# Patient Record
Sex: Male | Born: 1975 | Race: White | Hispanic: No | Marital: Married | Smoking: Never smoker
Health system: Southern US, Community
[De-identification: ages and names within clinical notes are randomized; demographics above are authoritative.]

## PROBLEM LIST (undated history)

## (undated) DIAGNOSIS — Z789 Other specified health status: Secondary | ICD-10-CM

## (undated) HISTORY — PX: NO PAST SURGERIES: SHX2092

---

## 2014-05-08 ENCOUNTER — Ambulatory Visit: Payer: Self-pay

## 2015-01-09 ENCOUNTER — Ambulatory Visit
Admission: EM | Admit: 2015-01-09 | Discharge: 2015-01-09 | Disposition: A | Discharge status: Home / Self Care | Payer: No Typology Code available for payment source | Attending: Family Medicine | Admitting: Family Medicine

## 2015-01-09 DIAGNOSIS — L03116 Cellulitis of left lower limb: Secondary | ICD-10-CM

## 2015-01-09 HISTORY — DX: Other specified health status: Z78.9

## 2015-01-09 MED ORDER — TETANUS-DIPHTH-ACELL PERTUSSIS 5-2.5-18.5 LF-MCG/0.5 IM SUSP
0.5000 mL | Freq: Once | INTRAMUSCULAR | Status: AC
  Administered 2015-01-09: 0.5 mL via INTRAMUSCULAR

## 2015-01-09 MED ORDER — AMOXICILLIN-POT CLAVULANATE 875-125 MG PO TABS: 1.0000 | ORAL_TABLET | Freq: Two times a day (BID) | ORAL | Status: DC

## 2015-01-09 MED ORDER — CEFTRIAXONE SODIUM 1 G IJ SOLR
1.0000 g | Freq: Once | INTRAMUSCULAR | Status: AC
Start: 2015-01-09 — End: 2015-01-09
  Administered 2015-01-09: 1 g via INTRAMUSCULAR

## 2015-01-09 NOTE — ED Provider Notes (Signed)
CSN: [id number]     Arrival date & time 01/09/15  1649 History   First MD Initiated Contact with Patient 01/09/15 1743     Chief Complaint  Patient presents with  . Wound Infection   (Consider location/radiation/quality/duration/timing/severity/associated sxs/prior Treatment) HPI Comments: 39 yo male with a "bug bite" about 2 weeks ago which ulcerated and has been progressively, slowly worsening. Patient seen 2 days ago at another walk-in clinic and started on Bactrim. However patient has been working, on his feet, standing all day for the past couple of days. States redness and swelling seems to be worsening. Denies any fevers or chills.   The history is provided by the patient.    Past Medical History  Diagnosis Date  . Patient denies medical problems    Past Surgical History  Procedure Laterality Date  . No past surgeries     Family History  Problem Relation Age of Onset  . Emphysema Mother     smoker  . Diabetes Other    History  Substance Use Topics  . Smoking status: Never Smoker   . Smokeless tobacco: Never Used  . Alcohol Use: No    Review of Systems  Allergies  Review of patient's allergies indicates no known allergies.  Home Medications   Prior to Admission medications   Medication Sig Start Date End Date Taking? Authorizing Provider  ibuprofen (ADVIL,MOTRIN) 200 MG tablet Take 600 mg by mouth every 6 (six) hours as needed.   Yes Historical Provider, MD  sulfamethoxazole-trimethoprim (BACTRIM DS,SEPTRA DS) 800-160 MG per tablet Take 1 tablet by mouth 2 (two) times daily.   Yes Historical Provider, MD  amoxicillin-clavulanate (AUGMENTIN) 875-125 MG per tablet Take 1 tablet by mouth 2 (two) times daily. 01/09/15   Payton Mccallumrlando Makylie Rivere, MD   BP 119/66 mmHg  Pulse 57  Temp(Src) 98.2 F (36.8 C) (Oral)  Resp 16  Ht 5\' 10"  (1.778 m)  Wt 190 lb (86.183 kg)  BMI 27.26 kg/m2  SpO2 100% Physical Exam  Constitutional: He appears well-developed and well-nourished. No  distress.  Skin: He is not diaphoretic. There is erythema.  Left lower extremity neurovascularly intact; skin area on anterior lower shin with 15x25 cm blanchable erythema, mild tenderness to palpation and a 2cm weeping superficial ulceration near the center of the erythematous area; mild edema down to ankle  Nursing note and vitals reviewed.   ED Course  Procedures (including critical care time) Labs Review Labs Reviewed - No data to display  Imaging Review No results found.   MDM   1. Cellulitis of left leg    New Prescriptions   AMOXICILLIN-CLAVULANATE (AUGMENTIN) 875-125 MG PER TABLET    Take 1 tablet by mouth 2 (two) times daily.  Plan: 1. diagnosis reviewed with patient 2. rx as per orders; risks, benefits, potential side effects reviewed with patient 3. Recommend supportive treatment with elevation of extremity and warm compresses; close monitoring 4. Continue Bactrim for MRSA coverage 5. Patient given Rocephin 1gm IM x1 and tetanus vaccine in clinic 6. F/u in 2 days with PCP or here; sooner here or ED prn if symptoms worsening  Payton Mccallumrlando Elenie Coven, MD 01/09/15 [phone]

## 2015-01-09 NOTE — ED Notes (Signed)
Pt believes he was bitten by a bug 3 weeks ago on the LLE. [facility] Sunday, and was started on Bactrim. Pt reports good compliance with the treatment, but states the infection has gotten worse. There is associated pain, redness, oozing.

## 2015-01-09 NOTE — Discharge Instructions (Signed)

## 2018-09-08 ENCOUNTER — Other Ambulatory Visit: Payer: Self-pay

## 2018-09-08 ENCOUNTER — Ambulatory Visit
Admission: EM | Admit: 2018-09-08 | Discharge: 2018-09-08 | Disposition: A | Discharge status: Home / Self Care | Payer: BLUE CROSS/BLUE SHIELD | Attending: Family Medicine | Admitting: Family Medicine

## 2018-09-08 ENCOUNTER — Encounter: Payer: Self-pay | Admitting: *Deleted

## 2018-09-08 DIAGNOSIS — R0602 Shortness of breath: Secondary | ICD-10-CM | POA: Diagnosis not present

## 2018-09-08 DIAGNOSIS — R062 Wheezing: Secondary | ICD-10-CM | POA: Diagnosis not present

## 2018-09-08 DIAGNOSIS — R05 Cough: Secondary | ICD-10-CM

## 2018-09-08 DIAGNOSIS — J189 Pneumonia, unspecified organism: Secondary | ICD-10-CM

## 2018-09-08 DIAGNOSIS — J181 Lobar pneumonia, unspecified organism: Principal | ICD-10-CM

## 2018-09-08 MED ORDER — AZITHROMYCIN 250 MG PO TABS: ORAL_TABLET | ORAL | 0 refills | Status: DC

## 2018-09-08 MED ORDER — HYDROCODONE-HOMATROPINE 5-1.5 MG/5ML PO SYRP: 5.0000 mL | ORAL_SOLUTION | Freq: Four times a day (QID) | ORAL | 0 refills | Status: DC | PRN

## 2018-09-08 NOTE — Discharge Instructions (Signed)
Fluids.  Medication as directed.  Use the cough medicine as needed.  Take care  Dr. Adriana Simas

## 2018-09-08 NOTE — ED Triage Notes (Signed)
Patient reports cough and SOB since Monday. No fever.

## 2018-09-09 NOTE — ED Provider Notes (Signed)
[facility]    CSN: [id number] Arrival date & time: 09/08/18  1847   History   Chief Complaint Chief Complaint  Patient presents with  . Cough  . Shortness of Breath   HPI  43 year old male presents with the above complaints.   Symptoms started abruptly on Monday. Reports cough, wheezing. Associated SOB. No documented fever. Symptoms are worsening. He has been using a humidifier without resolution. No known exacerbating factors. No reports of body aches. Has had recent sick contacts at home. No other associated symptoms. No other complaints.   PMH, Surgical Hx, Family Hx, Social History reviewed and updated as below.  Past Medical History:  Diagnosis Date  . Patient denies medical problems    Past Surgical History:  Procedure Laterality Date  . NO PAST SURGERIES      Home Medications    Prior to Admission medications   Medication Sig Start Date End Date Taking? Authorizing Provider  azithromycin (ZITHROMAX) 250 MG tablet 2 tablets on day 1, then 1 tablet daily on days 2-5. 09/08/18   Sheana Bir, Verdis Frederickson, DO  HYDROcodone-homatropine (HYCODAN) 5-1.5 MG/5ML syrup Take 5 mLs by mouth every 6 (six) hours as needed for cough. 09/08/18   Tommie Sams, DO  ibuprofen (ADVIL,MOTRIN) 200 MG tablet Take 600 mg by mouth every 6 (six) hours as needed.    [provider]   Family History Family History  Problem Relation Age of Onset  . Emphysema Mother        smoker  . Diabetes Other    Social History Social History   Tobacco Use  . Smoking status: Never Smoker  . Smokeless tobacco: Never Used  Substance Use Topics  . Alcohol use: No  . Drug use: No    Allergies   Patient has no known allergies.   Review of Systems Review of Systems  Constitutional: Negative for fever.  Respiratory: Positive for cough, shortness of breath and wheezing.    Physical Exam Triage Vital Signs ED Triage Vitals  Enc Vitals Group     BP 09/08/18 1920 (!) 134/94     Pulse  Rate 09/08/18 1920 72     Resp 09/08/18 1920 17     Temp 09/08/18 1920 98.3 F (36.8 C)     Temp Source 09/08/18 1920 Oral     SpO2 09/08/18 1920 99 %     Weight --      Height --      Head Circumference --      Peak Flow --      Pain Score 09/08/18 1919 0     Pain Loc --      Pain Edu? --      Excl. in GC? --    Updated Vital Signs BP (!) 134/94 (BP Location: Left Arm)   Pulse 72   Temp 98.3 F (36.8 C) (Oral)   Resp 17   SpO2 99%   Visual Acuity Right Eye Distance:   Left Eye Distance:   Bilateral Distance:    Right Eye Near:   Left Eye Near:    Bilateral Near:     Physical Exam Vitals signs and nursing note reviewed.  Constitutional:      General: He is not in acute distress.    Appearance: He is well-developed.  HENT:     Head: Normocephalic and atraumatic.     Right Ear: Tympanic membrane normal.     Left Ear: Tympanic membrane normal.  Mouth/Throat:     Pharynx: Oropharynx is clear. No oropharyngeal exudate or posterior oropharyngeal erythema.  Eyes:     General:        Right eye: No discharge.        Left eye: No discharge.     Conjunctiva/sclera: Conjunctivae normal.  Neck:     Musculoskeletal: Neck supple.  Cardiovascular:     Rate and Rhythm: Normal rate and regular rhythm.  Pulmonary:     Effort: Pulmonary effort is normal.     Comments: Left basilar crackles and wheezing. Lymphadenopathy:     Cervical: No cervical adenopathy.  Neurological:     Mental Status: He is alert.  Psychiatric:        Mood and Affect: Mood normal.        Behavior: Behavior normal.    UC Treatments / Results  Labs (all labs ordered are listed, but only abnormal results are displayed) Labs Reviewed - No data to display  EKG None  Radiology No results found.  Procedures Procedures (including critical care time)  Medications Ordered in UC Medications - No data to display  Initial Impression / Assessment and Plan / UC Course  I have reviewed the  triage vital signs and the nursing notes.  Pertinent labs & imaging results that were available during my care of the patient were reviewed by me and considered in my medical decision making (see chart for details).    43 year old male presents with suspected pneumonia based off of physical exam findings. We discussed imaging vs empiric treatment. Patient elected for the latter. Starting on Azithromycin. Hycodan for cough.   Final Clinical Impressions(s) / UC Diagnoses   Final diagnoses:  Community acquired pneumonia of left lower lobe of lung (HCC)     Discharge Instructions     Fluids.  Medication as directed.  Use the cough medicine as needed.  Take care  Dr. Adriana Simas    ED Prescriptions    Medication Sig Dispense Auth. Provider   azithromycin (ZITHROMAX) 250 MG tablet 2 tablets on day 1, then 1 tablet daily on days 2-5. 6 tablet Waleska Buttery G, DO   HYDROcodone-homatropine (HYCODAN) 5-1.5 MG/5ML syrup Take 5 mLs by mouth every 6 (six) hours as needed for cough. 120 mL Tommie Sams, DO     Controlled Substance Prescriptions [place] Controlled Substance Registry consulted? Not Applicable   Tommie Sams, [place] 09/09/18 2778

## 2018-09-27 ENCOUNTER — Ambulatory Visit (INDEPENDENT_AMBULATORY_CARE_PROVIDER_SITE_OTHER): Payer: BLUE CROSS/BLUE SHIELD

## 2018-09-27 ENCOUNTER — Ambulatory Visit
Admission: EM | Admit: 2018-09-27 | Discharge: 2018-09-27 | Disposition: A | Discharge status: Home / Self Care | Payer: BLUE CROSS/BLUE SHIELD | Attending: Family Medicine | Admitting: Family Medicine

## 2018-09-27 ENCOUNTER — Other Ambulatory Visit: Payer: Self-pay

## 2018-09-27 ENCOUNTER — Encounter: Payer: Self-pay | Admitting: Emergency Medicine

## 2018-09-27 DIAGNOSIS — R05 Cough: Secondary | ICD-10-CM

## 2018-09-27 DIAGNOSIS — J0110 Acute frontal sinusitis, unspecified: Secondary | ICD-10-CM

## 2018-09-27 DIAGNOSIS — J3089 Other allergic rhinitis: Secondary | ICD-10-CM

## 2018-09-27 DIAGNOSIS — R059 Cough, unspecified: Secondary | ICD-10-CM

## 2018-09-27 MED ORDER — DOXYCYCLINE HYCLATE 100 MG PO CAPS: 100.0000 mg | ORAL_CAPSULE | Freq: Two times a day (BID) | ORAL | 0 refills | Status: AC

## 2018-09-27 MED ORDER — ALBUTEROL SULFATE HFA 108 (90 BASE) MCG/ACT IN AERS: 2.0000 | INHALATION_SPRAY | Freq: Four times a day (QID) | RESPIRATORY_TRACT | 0 refills | Status: AC | PRN

## 2018-09-27 NOTE — Discharge Instructions (Addendum)
Recommend start Albuterol inhaler 2 puffs every 6 hours as needed for cough or chest tightness. Switch to another OTC antihistamine daily. If sinus pressure and symptoms do not improve within 1 to 2 days, may start Doxycycline 100mg  twice a day as directed. May continue OTC Nyquil at night. Continue to increase fluids to help loosen up mucus in chest. Follow-up in 7 to 10 days if symptoms are not resolving.

## 2018-09-27 NOTE — ED Provider Notes (Signed)
[facility]    CSN: [id number] Arrival date & time: 09/27/18  1041     History   Chief Complaint Chief Complaint  Patient presents with   Cough    HPI Dennis Hood is a 43 y.o. male.   43 year old male presents with continued cough and occasional shortness of breath for over 3 weeks. He was seen here on 09/08/2018 for probable left lower pneumonia and started on Zithromax and cough medication with codeine. He finished the Zithromax but still had lingering cough with mucus. Denies any fever, sore throat or GI symptoms. Now having more frontal sinus pressure and congestion, especially when coughing. He does have seasonal allergies and has been taking OTC generic Allegra or similar and Flonase with minimal relief. Currently he is taking Nyquil at night with some relief. No other family members ill. Does not smoke. No recent travel or contact with positive COVID-19. Otherwise no chronic health issues. Takes no daily medication.   The history is provided by the patient.    Past Medical History:  Diagnosis Date   Patient denies medical problems     There are no active problems to display for this patient.   Past Surgical History:  Procedure Laterality Date   NO PAST SURGERIES         Home Medications    Prior to Admission medications   Medication Sig Start Date End Date Taking? Authorizing Provider  albuterol (VENTOLIN HFA) 108 (90 Base) MCG/ACT inhaler Inhale 2 puffs into the lungs every 6 (six) hours as needed for wheezing (or chest tightness). 09/27/18   Sudie Grumbling, NP  doxycycline (VIBRAMYCIN) 100 MG capsule Take 1 capsule (100 mg total) by mouth 2 (two) times daily for 7 days. 09/27/18 10/04/18  Sudie Grumbling, NP    Family History Family History  Problem Relation Age of Onset   Emphysema Mother        smoker   Heart attack Father 45   Diabetes Other     Social History Social History   Tobacco Use   Smoking status: Never Smoker    Smokeless tobacco: Never Used  Substance Use Topics   Alcohol use: No   Drug use: No     Allergies   Patient has no known allergies.   Review of Systems Review of Systems  Constitutional: Negative for activity change, appetite change, chills, fatigue and fever.  HENT: Positive for congestion (mainly nasal ), postnasal drip and sinus pressure. Negative for ear discharge, ear pain, facial swelling, mouth sores, nosebleeds, rhinorrhea, sinus pain, sneezing, sore throat and trouble swallowing.   Eyes: Negative for pain, discharge, redness and itching.  Respiratory: Positive for cough, shortness of breath and wheezing. Negative for chest tightness and stridor.   Cardiovascular: Negative for chest pain.  Gastrointestinal: Negative for abdominal pain, nausea and vomiting.  Musculoskeletal: Negative for arthralgias, myalgias, neck pain and neck stiffness.  Skin: Negative for color change, rash and wound.  Allergic/Immunologic: Positive for environmental allergies. Negative for immunocompromised state.  Neurological: Positive for headaches. Negative for dizziness, tremors, seizures, syncope, weakness, light-headedness and numbness.  Hematological: Negative for adenopathy. Does not bruise/bleed easily.     Physical Exam Triage Vital Signs ED Triage Vitals  Enc Vitals Group     BP 09/27/18 1050 (!) 143/96     Pulse Rate 09/27/18 1050 66     Resp 09/27/18 1050 16     Temp 09/27/18 1050 98 F (36.7 C)  Temp Source 09/27/18 1050 Oral     SpO2 09/27/18 1050 100 %     Weight 09/27/18 1049 215 lb (97.5 kg)     Height 09/27/18 1049 5\' 10"  (1.778 m)     Head Circumference --      Peak Flow --      Pain Score 09/27/18 1049 0     Pain Loc --      Pain Edu? --      Excl. in GC? --    No data found.  Updated Vital Signs BP (!) 143/96 (BP Location: Left Arm)    Pulse 66    Temp 98 F (36.7 C) (Oral)    Resp 16    Ht 5\' 10"  (1.778 m)    Wt 215 lb (97.5 kg)    SpO2 100%    BMI 30.85  kg/m   Visual Acuity Right Eye Distance:   Left Eye Distance:   Bilateral Distance:    Right Eye Near:   Left Eye Near:    Bilateral Near:     Physical Exam Vitals signs and nursing note reviewed.  Constitutional:      General: He is awake. He is not in acute distress.    Appearance: Normal appearance. He is well-developed and well-groomed. He is not ill-appearing.     Comments: Patient sitting comfortably on exam table in no acute distress.   HENT:     Head: Normocephalic and atraumatic.     Right Ear: Hearing, tympanic membrane, ear canal and external ear normal.     Left Ear: Hearing, tympanic membrane, ear canal and external ear normal.     Nose: No congestion or rhinorrhea.     Right Turbinates: Swollen.     Left Turbinates: Swollen.     Right Sinus: Frontal sinus tenderness present. No maxillary sinus tenderness.     Left Sinus: Frontal sinus tenderness present. No maxillary sinus tenderness.     Mouth/Throat:     Lips: Pink.     Mouth: Mucous membranes are moist.     Pharynx: Uvula midline. Posterior oropharyngeal erythema present. No pharyngeal swelling or oropharyngeal exudate.  Eyes:     Extraocular Movements: Extraocular movements intact.     Conjunctiva/sclera: Conjunctivae normal.  Neck:     Musculoskeletal: Normal range of motion and neck supple. No neck rigidity or muscular tenderness.  Cardiovascular:     Rate and Rhythm: Normal rate and regular rhythm.     Heart sounds: Normal heart sounds. No murmur.  Pulmonary:     Effort: Pulmonary effort is normal. No respiratory distress.     Breath sounds: Normal air entry. No stridor. Examination of the right-upper field reveals decreased breath sounds and wheezing. Examination of the left-upper field reveals decreased breath sounds and wheezing. Examination of the right-lower field reveals decreased breath sounds. Examination of the left-lower field reveals decreased breath sounds. Decreased breath sounds and wheezing  present. No rhonchi or rales.     Comments: Slight coarse breath sounds in lower lobes when coughing. Mild wheezes in upper lobes bilaterally.  Musculoskeletal: Normal range of motion.  Lymphadenopathy:     Cervical: No cervical adenopathy.  Skin:    General: Skin is warm and dry.     Capillary Refill: Capillary refill takes less than 2 seconds.     Findings: No rash.  Neurological:     General: No focal deficit present.     Mental Status: He is alert and oriented to person, place,  and time.  Psychiatric:        Mood and Affect: Mood normal.        Behavior: Behavior normal. Behavior is cooperative.        Thought Content: Thought content normal.        Judgment: Judgment normal.      UC Treatments / Results  Labs (all labs ordered are listed, but only abnormal results are displayed) Labs Reviewed - No data to display  EKG None  Radiology Dg Chest 2 View  Result Date: 09/27/2018 CLINICAL DATA:  Cough for 3 weeks.  Shortness of breath. EXAM: CHEST - 2 VIEW COMPARISON:  None. FINDINGS: The heart size and mediastinal contours are within normal limits. Both lungs are clear. The visualized skeletal structures are unremarkable. IMPRESSION: No active cardiopulmonary disease. Electronically Signed   By: Elige Ko   On: 09/27/2018 11:39    Procedures Procedures (including critical care time)  Medications Ordered in UC Medications - No data to display  Initial Impression / Assessment and Plan / UC Course  I have reviewed the triage vital signs and the nursing notes.  Pertinent labs & imaging results that were available during my care of the patient were reviewed by me and considered in my medical decision making (see chart for details).    Reviewed chest x-ray results with patient- no pneumonia or fluid. Discussed that he may have a cough due to sinus drainage from either early frontal sinus infection and/or seasonal allergies. Recommend trial Albuterol inhaler 2 puffs every 6  hours as needed for cough or wheezing. Recommend switch to a different OTC antihistamine (since uncertain if it is generic Allegra or Zyrtec) and continue Flonase as directed. If sinus pressure and symptoms gets worse in the next 2 days, may start Doxycycline 100mg  twice a day as directed. May continue Nyquil at night as needed. Continue to increase fluids to help loosen up mucus in chest. May also use a Netipot as directed for sinus congestion. Follow-up in 7 to 10 days if symptoms are not resolving.  Final Clinical Impressions(s) / UC Diagnoses   Final diagnoses:  Cough  Acute non-recurrent frontal sinusitis  Allergic rhinitis due to other allergic trigger, unspecified seasonality     Discharge Instructions     Recommend start Albuterol inhaler 2 puffs every 6 hours as needed for cough or chest tightness. Switch to another OTC antihistamine daily. If sinus pressure and symptoms do not improve within 1 to 2 days, may start Doxycycline 100mg  twice a day as directed. May continue OTC Nyquil at night. Continue to increase fluids to help loosen up mucus in chest. Follow-up in 7 to 10 days if symptoms are not resolving.     ED Prescriptions    Medication Sig Dispense Auth. Provider   albuterol (VENTOLIN HFA) 108 (90 Base) MCG/ACT inhaler Inhale 2 puffs into the lungs every 6 (six) hours as needed for wheezing (or chest tightness). 1 Inhaler Kayden Amend, Ali Lowe, NP   doxycycline (VIBRAMYCIN) 100 MG capsule Take 1 capsule (100 mg total) by mouth 2 (two) times daily for 7 days. 14 capsule Sudie Grumbling, NP     Controlled Substance Prescriptions [place] Controlled Substance Registry consulted? Not Applicable   Sudie Grumbling, NP 09/27/18 [phone]

## 2018-09-27 NOTE — ED Triage Notes (Signed)
Patient in today c/o continued cough after being seen here on 09/08/18.

## 2018-12-29 ENCOUNTER — Other Ambulatory Visit: Payer: Self-pay

## 2018-12-29 ENCOUNTER — Ambulatory Visit (INDEPENDENT_AMBULATORY_CARE_PROVIDER_SITE_OTHER): Payer: BLUE CROSS/BLUE SHIELD

## 2018-12-29 ENCOUNTER — Telehealth: Payer: Self-pay

## 2018-12-29 ENCOUNTER — Ambulatory Visit
Admission: EM | Admit: 2018-12-29 | Discharge: 2018-12-29 | Disposition: A | Discharge status: Home / Self Care | Payer: BLUE CROSS/BLUE SHIELD | Attending: Family Medicine | Admitting: Family Medicine

## 2018-12-29 ENCOUNTER — Other Ambulatory Visit: Payer: No Typology Code available for payment source

## 2018-12-29 ENCOUNTER — Encounter: Payer: Self-pay | Admitting: Emergency Medicine

## 2018-12-29 DIAGNOSIS — R05 Cough: Secondary | ICD-10-CM | POA: Diagnosis not present

## 2018-12-29 DIAGNOSIS — Z7189 Other specified counseling: Secondary | ICD-10-CM | POA: Diagnosis not present

## 2018-12-29 DIAGNOSIS — Z20822 Contact with and (suspected) exposure to covid-19: Secondary | ICD-10-CM

## 2018-12-29 DIAGNOSIS — J189 Pneumonia, unspecified organism: Secondary | ICD-10-CM | POA: Diagnosis not present

## 2018-12-29 DIAGNOSIS — R059 Cough, unspecified: Secondary | ICD-10-CM

## 2018-12-29 MED ORDER — AZITHROMYCIN 250 MG PO TABS: 250.0000 mg | ORAL_TABLET | Freq: Every day | ORAL | 0 refills | Status: AC

## 2018-12-29 MED ORDER — DIPHENHYDRAMINE HCL 2 % EX CREA: TOPICAL_CREAM | Freq: Three times a day (TID) | CUTANEOUS | 0 refills | Status: DC | PRN

## 2018-12-29 MED ORDER — AMOXICILLIN-POT CLAVULANATE ER 1000-62.5 MG PO TB12: 2.0000 | ORAL_TABLET | Freq: Two times a day (BID) | ORAL | 0 refills | Status: AC

## 2018-12-29 NOTE — ED Triage Notes (Signed)
Patient states he has had some shortness of breath and a low grade fever since Saturday

## 2018-12-29 NOTE — Telephone Encounter (Signed)
Contacted by Overton Mam NP. At [facility]. Pt needs COVID-19 testing.  Office [phone] Fax [phone]  Pt is at office Appointment scheduled and order placed. Pt made aware of all instructions by Overton Mam NP.

## 2018-12-29 NOTE — Discharge Instructions (Addendum)
It was very nice seeing you today in clinic. Thank you for entrusting me with your care.   As discussed, you have pneumonia. Please utilize the medications that we discussed. Your prescriptions have been called in to your pharmacy. May use Tylenol and/or Ibuprofen as needed for pain/fever. Increase fluid intake as much as possible. Water is always best, as sugar and caffeine containing fluids can cause you to become dehydrated. Try to incorporate electrolyte enriched fluids, such as Gatorade or Pedialyte, into your daily fluid intake.   COVID testing today at [facility]. Be there by 3:30 - 3:45. Stay in your car. Wear mask.    Make arrangements to follow up with your regular doctor in 1 week for re-evaluation, or sooner if you are worsening. If your symptoms/condition worsens, please seek follow up care either here or in the ER. Please remember, our [facility] providers are "right here with you" when you need [place].   Again, it was my pleasure to take care of you today. Thank you for choosing our clinic. I hope that you start to feel better quickly.   Honor Loh, MSN, APRN, FNP-C, CEN Advanced Practice Provider [facility]

## 2018-12-29 NOTE — ED Provider Notes (Addendum)
Name: Dennis NippleWilliam Pichette DOB: 11-Oct-1975 MRN: [id number] CSN: [id number] PCP: Patient, No Pcp Per  Arrival date and time:  12/29/18 1325  Chief Complaint:  Shortness of Breath  NOTE: Prior to seeing the patient today, I have reviewed the triage nursing documentation and vital signs. Clinical staff has updated patient's PMH/PSHx, current medication list, and drug allergies/intolerances to ensure comprehensive history available to assist in medical decision making.   History:   HPI: Dennis Hood is a 43 y.o. male who presents today with complaints of cough and shortness of breath since Saturday (12/25/2018). Patient does not smoke. He denies known contact with sick individuals; works with the public as a Paediatric nursebarber. Patient reporting low grade temperatures (Tmax 99.5). He has experienced marked fatigue with associated diffuse myalgias. He denies nausea, vomiting, diarrhea, and chills. He notes that his appetite has been decreased secondary to newly developed dysgeusia and anosmia; "I can't smell anything and things just taste funny". Patient taking daily OTC allergy medications to help with his symptoms.   Of note, patient was seen here back on 09/08/2018 by Dr. Adriana Simasook. Patient with cough, wheezing, and SOB. Imaging was deferred, and patient was treated based on exam for CAP using azithromycin. Patient came back into the clinic on 09/27/2018, and was seen by Carita PianAmyot, NP for persistent symptoms. He had completed the prescribed course of antibiotics, and was using daily allergy medications, however his symptoms had not improved. Radiographs of the chest revealed no evidence of peribronchial thickening, areas of consolidation, or focal infiltrates. Trial of albuterol was discussed. Patient given a provisional prescription of doxycycline if not clinically improving in 2 days time. Patient advising that he ended up taking the prescribed doxycycline, which improved his symptoms.    Past Medical History:    Diagnosis Date   Patient denies medical problems     Past Surgical History:  Procedure Laterality Date   NO PAST SURGERIES      Family History  Problem Relation Age of Onset   Emphysema Mother        smoker   Heart attack Father [id number]   Diabetes Other     Social History   Socioeconomic History   Marital status: Married    Spouse name: Not on file   Number of children: Not on file   Years of education: Not on file   Highest education level: Not on file  Occupational History   Not on file  Social Needs   Financial resource strain: Not on file   Food insecurity    Worry: Not on file    Inability: Not on file   Transportation needs    Medical: Not on file    Non-medical: Not on file  Tobacco Use   Smoking status: Never Smoker   Smokeless tobacco: Never Used  Substance and Sexual Activity   Alcohol use: No   Drug use: No   Sexual activity: Not on file  Lifestyle   Physical activity    Days per week: Not on file    Minutes per session: Not on file   Stress: Not on file  Relationships   Social connections    Talks on phone: Not on file    Gets together: Not on file    Attends religious service: Not on file    Active member of club or organization: Not on file    Attends meetings of clubs or organizations: Not on file    Relationship status: Not on file   Intimate  partner violence    Fear of current or ex partner: Not on file    Emotionally abused: Not on file    Physically abused: Not on file    Forced sexual activity: Not on file  Other Topics Concern   Not on file  Social History Narrative   Not on file    There are no active problems to display for this patient.   Home Medications:    Current Meds  Medication Sig   albuterol (VENTOLIN HFA) 108 (90 Base) MCG/ACT inhaler Inhale 2 puffs into the lungs every 6 (six) hours as needed for wheezing (or chest tightness).    Allergies:   Patient has no known allergies.  Review  of Systems (ROS): Review of Systems  Constitutional: Positive for activity change (decreased), appetite change (decreased), fatigue and fever (low grade; Tmax 99.5). Negative for chills.  HENT: Negative for congestion, postnasal drip, rhinorrhea, sinus pressure, sinus pain, sneezing, sore throat and trouble swallowing.        (+) dysgeusia. (+) anosmia  Respiratory: Positive for cough, chest tightness and shortness of breath.   Cardiovascular: Negative for chest pain and palpitations.  Gastrointestinal: Negative for abdominal pain, diarrhea, nausea and vomiting.  Genitourinary: Negative.   Musculoskeletal: Positive for myalgias. Negative for back pain and neck pain.  Skin: Negative.   Neurological: Positive for weakness (generalized). Negative for dizziness, light-headedness and headaches.  Hematological: Negative for adenopathy.     Physical Exam:  Triage Vital Signs ED Triage Vitals  Enc Vitals Group     BP 12/29/18 1336 125/86     Pulse Rate 12/29/18 1336 74     Resp 12/29/18 1336 18     Temp 12/29/18 1336 98.6 F (37 C)     Temp Source 12/29/18 1336 Oral     SpO2 12/29/18 1336 98 %     Weight 12/29/18 1337 210 lb (95.3 kg)     Height 12/29/18 1337 5\' 10"  (1.778 m)     Head Circumference --      Peak Flow --      Pain Score 12/29/18 1337 3     Pain Loc --      Pain Edu? --      Excl. in GC? --     Physical Exam  Constitutional: He is oriented to person, place, and time and well-developed, well-nourished, and in no distress.  HENT:  Head: Normocephalic and atraumatic.  Right Ear: External ear normal.  Left Ear: External ear normal.  Mouth/Throat: Oropharynx is clear and moist and mucous membranes are normal.  Eyes: Pupils are equal, round, and reactive to light. EOM are normal.  Neck: Normal range of motion. Neck supple. No tracheal deviation present.  Cardiovascular: Normal rate, regular rhythm, normal heart sounds and intact distal pulses. Exam reveals no gallop and  no friction rub.  No murmur heard. Pulmonary/Chest: Effort normal. No respiratory distress. He has decreased breath sounds in the right lower field. He has no wheezes. He has rhonchi (clears somewhat with cough) in the right lower field, the left upper field and the left lower field. He has no rales.  Abdominal: Soft. Bowel sounds are normal. There is no abdominal tenderness.  Lymphadenopathy:    He has no cervical adenopathy.  Neurological: He is alert and oriented to person, place, and time. Gait normal. GCS score is 15.  Skin: Skin is warm and dry. No rash noted. No erythema.  Psychiatric: Mood, affect and judgment normal.  Nursing note and  vitals reviewed.    Urgent Care Treatments / Results:   LABS: PLEASE NOTE: all labs that were ordered this encounter are listed, however only abnormal results are displayed. Labs Reviewed - No data to display  URGENT CARE ECG REPORT Date: 12/29/2018 Time ECG obtained: 1346 Rate: 65 bpm Rhythm: normal sinus rhythm Axis (leads I and aVF): normal Intervals: normal ST segment and T wave changes: No evidence of ST segment elevation of depression Comparison: No previous tracings available for review and comparison.   RADIOLOGY: Dg Chest 2 View  Result Date: 12/29/2018 CLINICAL DATA:  Cough and shortness of breath. EXAM: CHEST - 2 VIEW COMPARISON:  09/27/2018 FINDINGS: New patchy densities along the periphery of the left lung and left hilar region. Few densities in the periphery of the mid right lung. Heart size is within normal limits and stable. Negative for a pneumothorax. Trachea is midline. No large pleural effusions. Bone structures are unremarkable. IMPRESSION: New airspace densities in left lung. Findings are compatible with pneumonia. Question a small focus of infection in the right lung as described. Electronically Signed   By: Markus Daft M.D.   On: 12/29/2018 14:43   PROCEDURES: Procedures  MEDICATIONS RECEIVED THIS VISIT: Medications -  No data to display  PERTINENT CLINICAL COURSE NOTES/UPDATES:   Initial Impression / Assessment and Plan / Urgent Care Course:    Fredrick Geoghegan is a 43 y.o. male who presents to [facility] today with complaints of Shortness of Breath  Pertinent labs & imaging results that were available during my care of the patient were personally reviewed by me and considered in my medical decision making (see lab/imaging section of note for values and interpretations).  Patient acutely ill appearing (non-toxic) today in clinic. He is in no acute distress. Exam reveals decreased breath sounds in the RLL. He has rhonchi that improve somewhat with cough in the RLL, LUL, and LLL. No fevers; low grade temperatures (Tmax 99.5). He feels short of breath. (+) decreased appetite; making efforts to remain hydrated. No nausea, vomiting, or diarrhea. He notes diffuse myalgias. EKG shows NSR without ectopy at a rate of 65 bpm. Radiographs of the chest reveal airspace densities in the periphery of the LEFT lung, with questionable are of infectious focus in the RLL. Finding consistent with BILATERAL community acquired pneumonia. Discussed outpatient vs. inpatient treatment with patient. Patient would rather pursue treatment on an outpatient basis if possible. His exam is reassuring overall. His breathing is stable; no accessory muscle use or retractions. VS are normal; no hypoxia, tachypnea, or tachycardia. Based on his subjective and objective assessment, I feel as if outpatient treatment is reasonable for this patient. Reviewed clinical presentation with attending Lacinda Axon, MD) who also feels as if patient can be treated successfully as an outpatient. Given his history of recurrent symptoms, and past antimicrobial treatment regimens, I will treat with high dose Augmentin XR 2000/125 mg BID x 10 days, adding in atypical coverage with azithromycin x 5 days. May use Tylenol and/or Ibuprofen as needed for pain/fever. He was  encouraged to ensure that he was maintaining adequate hydration and resting as much as possible.   Given his symptom constellation and potential risk for exposure working with the public, we discussed that SARS-CoV-2 (novel coronavirus) had to be considered. Discussed having patient tested for the viral infection today. Patient aware that SARS-CoV-2 could be contributory to his current pneumonia. Questions fielded and reassurance provided. Patient in agreement with discussed plan of care. Call placed to testing  center and appointment secured for 1545 today to have testing performed. Patient to self quarantine, based on [place] DHHS guidelines, until negative results received.  Discussed follow up with primary care physician in 1 week for re-evaluation. I have reviewed the follow up and strict return precautions for any new or worsening symptoms. Patient is aware of symptoms that would be deemed urgent/emergent, and would thus require further evaluation either here or in the emergency department. At the time of discharge, he verbalized understanding and consent with the discharge plan as it was reviewed with him. All questions were fielded by provider and/or clinic staff prior to patient discharge.    Final Clinical Impressions(s) / Urgent Care Diagnoses:   Final diagnoses:  Cough  Advice Given About Covid-19 Virus Infection  Community acquired pneumonia, unspecified laterality    New Prescriptions:   Meds ordered this encounter  Medications   amoxicillin-clavulanate (AUGMENTIN XR) 1000-62.5 MG 12 hr tablet    Sig: Take 2 tablets by mouth 2 (two) times daily.    Dispense:  40 tablet    Refill:  0   azithromycin (ZITHROMAX) 250 MG tablet    Sig: Take 1 tablet (250 mg total) by mouth daily. Take 2 tabs (500 mg) x 1 day, then 1 tab (250 mg) daily x 4 days.    Dispense:  6 tablet    Refill:  0    Controlled Substance Prescriptions:  [place] Controlled Substance Registry consulted? Not  Applicable  NOTE: This note was prepared using Dragon dictation software along with smaller phrase technology. Despite my best ability to proofread, there is the potential that transcriptional errors may still occur from this process, and are completely unintentional.    Verlee MonteGray, Axcel Horsch E, NP 12/30/18 0003

## 2019-01-02 LAB — NOVEL CORONAVIRUS, NAA: SARS-CoV-2, NAA: DETECTED — AB

## 2019-02-11 ENCOUNTER — Telehealth: Payer: Self-pay | Admitting: Emergency Medicine

## 2019-02-11 NOTE — Telephone Encounter (Signed)
Patients wife called regarding bill for a recent visit.  Wife states she did not understand why the was being billed for an EKG.  I went over patients signed AOB and explained that the co pay is for the initial visit but the additional services such as EKG, X-ray, equipment are billed in addition to the co pay. Patients wife was very appreciative of the explanation and verbalized understanding.

## 2019-03-19 ENCOUNTER — Ambulatory Visit
Admission: EM | Admit: 2019-03-19 | Discharge: 2019-03-19 | Disposition: A | Discharge status: Home / Self Care | Payer: BLUE CROSS/BLUE SHIELD | Attending: Emergency Medicine | Admitting: Emergency Medicine

## 2019-03-19 ENCOUNTER — Other Ambulatory Visit: Payer: Self-pay

## 2019-03-19 ENCOUNTER — Encounter: Payer: Self-pay | Admitting: Emergency Medicine

## 2019-03-19 DIAGNOSIS — J42 Unspecified chronic bronchitis: Secondary | ICD-10-CM

## 2019-03-19 DIAGNOSIS — J189 Pneumonia, unspecified organism: Secondary | ICD-10-CM

## 2019-03-19 MED ORDER — LEVOFLOXACIN 750 MG PO TABS: 750.0000 mg | ORAL_TABLET | Freq: Every day | ORAL | 0 refills | Status: AC

## 2019-03-19 MED ORDER — MONTELUKAST SODIUM 10 MG PO TABS
10.0000 mg | ORAL_TABLET | Freq: Every day | ORAL | 0 refills | Status: AC
Start: 2019-03-19 — End: 2019-04-02

## 2019-03-19 NOTE — ED Provider Notes (Signed)
[facility] - [place], Dennis Hood   Name: Dennis Hood DOB: [birth year] MRN: [id number] CSN: [id number] PCP: Patient, No Pcp Per  Arrival date and time:  03/19/19 1008  Chief Complaint:  Sinus Problem and Cough   NOTE: Prior to seeing the patient today, I have reviewed the triage nursing documentation and vital signs. Clinical staff has updated patient's PMH/PSHx, current medication list, and drug allergies/intolerances to ensure comprehensive history available to assist in medical decision making.   History:   HPI: Dennis Hood is a 43 y.o. male who presents today with complaints of cough, chest congestion and drainage. This is a chronic problem with an acute exacerbation. He states he's had "lung problems" for 18 years, but has never seen a specialist. This current episode started about 3 days ago with green nasal drainage, post nasal drainage and chest congestion. He says the cough and congestion is consistent and isn't getting better. He started taking an "old Augmentin" prescription and hasn't noted any changes with that. Denies fevers, chills, body aches.   Pertinent PMH: diagnosed with pna and COVID-19 in 12/2018. Received Z-pack and he completed abx. Never retested negative.      Past Medical History:  Diagnosis Date   Patient denies medical problems     Past Surgical History:  Procedure Laterality Date   NO PAST SURGERIES      Family History  Problem Relation Age of Onset   Emphysema Mother        smoker   Heart attack Father 94   Diabetes Other     Social History   Tobacco Use   Smoking status: Never Smoker   Smokeless tobacco: Never Used  Substance Use Topics   Alcohol use: No   Drug use: No    There are no active problems to display for this patient.   Home Medications:    Current Meds  Medication Sig   albuterol (VENTOLIN HFA) 108 (90 Base) MCG/ACT inhaler Inhale 2 puffs into the lungs every 6 (six) hours as needed for  wheezing (or chest tightness).    Allergies:   Patient has no known allergies.  Review of Systems (ROS): Review of Systems  Constitutional: Negative for activity change, appetite change, chills, diaphoresis and fatigue.  HENT: Positive for postnasal drip and sore throat. Negative for congestion, sinus pain and sneezing.   Respiratory: Positive for cough, shortness of breath and wheezing.   Cardiovascular: Negative for chest pain.  Gastrointestinal: Negative for diarrhea, nausea and vomiting.  All other systems reviewed and are negative.    Vital Signs: Today's Vitals   03/19/19 1033 03/19/19 1037 03/19/19 1114  BP:  (!) 140/104   Pulse:  60   Resp:  16   Temp:  98.3 F (36.8 C)   TempSrc:  Oral   SpO2:  100%   Weight: 210 lb (95.3 kg)    Height: 5\' 10"  (1.778 m)    PainSc: 0-No pain  0-No pain    Physical Exam: Physical Exam Vitals signs and nursing note reviewed.  Constitutional:      Appearance: He is well-developed.  HENT:     Head: Normocephalic and atraumatic.     Mouth/Throat:     Pharynx: Posterior oropharyngeal erythema present.  Eyes:     Conjunctiva/sclera: Conjunctivae normal.  Neck:     Musculoskeletal: Neck supple.  Cardiovascular:     Rate and Rhythm: Normal rate and regular rhythm.     Heart sounds: No murmur.  Pulmonary:  Effort: Pulmonary effort is normal. No tachypnea or respiratory distress.     Breath sounds: Examination of the left-upper field reveals rhonchi. Examination of the left-middle field reveals rhonchi and rales. Examination of the left-lower field reveals wheezing, rhonchi and rales. Wheezing, rhonchi and rales present.  Abdominal:     Palpations: Abdomen is soft.     Tenderness: There is no abdominal tenderness.  Skin:    General: Skin is warm and dry.  Neurological:     Mental Status: He is alert.     Urgent Care Treatments / Results:   LABS: PLEASE NOTE: all labs that were ordered this encounter are listed, however  only abnormal results are displayed. Labs Reviewed - No data to display  EKG: -None  RADIOLOGY: No results found.  Patient deferred a CXR due to cost.   PROCEDURES: Procedures  MEDICATIONS RECEIVED THIS VISIT: Medications - No data to display  PERTINENT CLINICAL COURSE NOTES/UPDATES:   Initial Impression / Assessment and Plan / Urgent Care Course:  Pertinent labs & imaging results that were available during my care of the patient were personally reviewed by me and considered in my medical decision making (see lab/imaging section of note for values and interpretations).  Dennis Hood is a 43 y.o. male who [facility] with complaints of cough and chest congestion, diagnosed with pna and chronic bronchitis, and treated as such with the medications below. NP and patient reviewed discharge instructions below during visit.   Pt and NP had extensive discussion regarding the necessity of certain tests and medications (I.e.CXR and sterios). Due to the length of the symptoms, the pt's hist of COVID-19, I stressed the importance of a CXR to assure that the previously noted pna is diminishing and there isn't any underlying issues. Pt deferred testing because he received a bill from his previous visit and would rather just get treated. Pt also deferred any form of steroids because he didn't like how the oral steriods made him feel when he took it many years ago, and he doesn't like needles.   Patient is well appearing overall in clinic today. He does not appear to be in any acute distress. Presenting symptoms (see HPI) and exam as documented above.   I have reviewed the follow up and strict return precautions for any new or worsening symptoms. Patient is aware of symptoms that would be deemed urgent/emergent, and would thus require further evaluation either here or in the emergency department. At the time of discharge, he verbalized understanding and consent with the  discharge plan as it was reviewed with him. All questions were fielded by provider and/or clinic staff prior to patient discharge.    Final Clinical Impressions / Urgent Care Diagnoses:   Final diagnoses:  Pneumonia of left lung due to infectious organism, unspecified part of lung  Chronic bronchitis, unspecified chronic bronchitis type (HCC)    New Prescriptions:  [place] Controlled Substance Registry consulted? Not Applicable  Meds ordered this encounter  Medications   montelukast (SINGULAIR) 10 MG tablet    Sig: Take 1 tablet (10 mg total) by mouth at bedtime for 14 days.    Dispense:  14 tablet    Refill:  0   levofloxacin (LEVAQUIN) 750 MG tablet    Sig: Take 1 tablet (750 mg total) by mouth daily.    Dispense:  10 tablet    Refill:  0      Discharge Instructions     You were seen today  for a cough and chest congestion. You decided to not complete a chest X-ray today because of cost, so you are being treated for unresolved pneumonia.   Take the antibiotics as prescribed until they're finished. If you think you're having a reaction, stop the medication, take benadryl and go to the nearest urgent care/emergency room. Take a probiotic while taking the antibiotic to decrease the chances of stomach upset.   Other over the counter medications to take: Sudafed (for no more than 3 days), and Zyrtec.  Use your albuterol inhaler every 6 hours for two day, then switch to as needed.   Get a primary care provider as soon as possible.     Recommended Follow up Care:  Patient encouraged to follow up with the following provider within the specified time frame, or sooner as dictated by the severity of his symptoms. As always, he was instructed that for any urgent/emergent care needs, he should seek care either here or in the emergency department for more immediate evaluation.   Bailey Mech, DNP, NP-cBailey Mech, NP 03/19/19 1406

## 2019-03-19 NOTE — ED Triage Notes (Signed)
Patient c/o sinus congestion and pressure that started a week ago.  Patient also reports green nasal drainage.  Patient reports that the cough started 3 days ago. Patient denies recent fevers.

## 2019-03-19 NOTE — Discharge Instructions (Signed)
You were seen today for a cough and chest congestion. You decided to not complete a chest X-ray today because of cost, so you are being treated for unresolved pneumonia.   Take the antibiotics as prescribed until they're finished. If you think you're having a reaction, stop the medication, take benadryl and go to the nearest urgent care/emergency room. Take a probiotic while taking the antibiotic to decrease the chances of stomach upset.   Other over the counter medications to take: Sudafed (for no more than 3 days), and Zyrtec.  Use your albuterol inhaler every 6 hours for two day, then switch to as needed.   Get a primary care provider as soon as possible.

## 2019-12-23 IMAGING — CR CHEST - 2 VIEW
2 series · 3 of 3 positions shown · non-contrast
Comparison: None.

CLINICAL DATA: Cough for 3 weeks.  Shortness of breath.

EXAM:
CHEST - 2 VIEW

[chest pa]
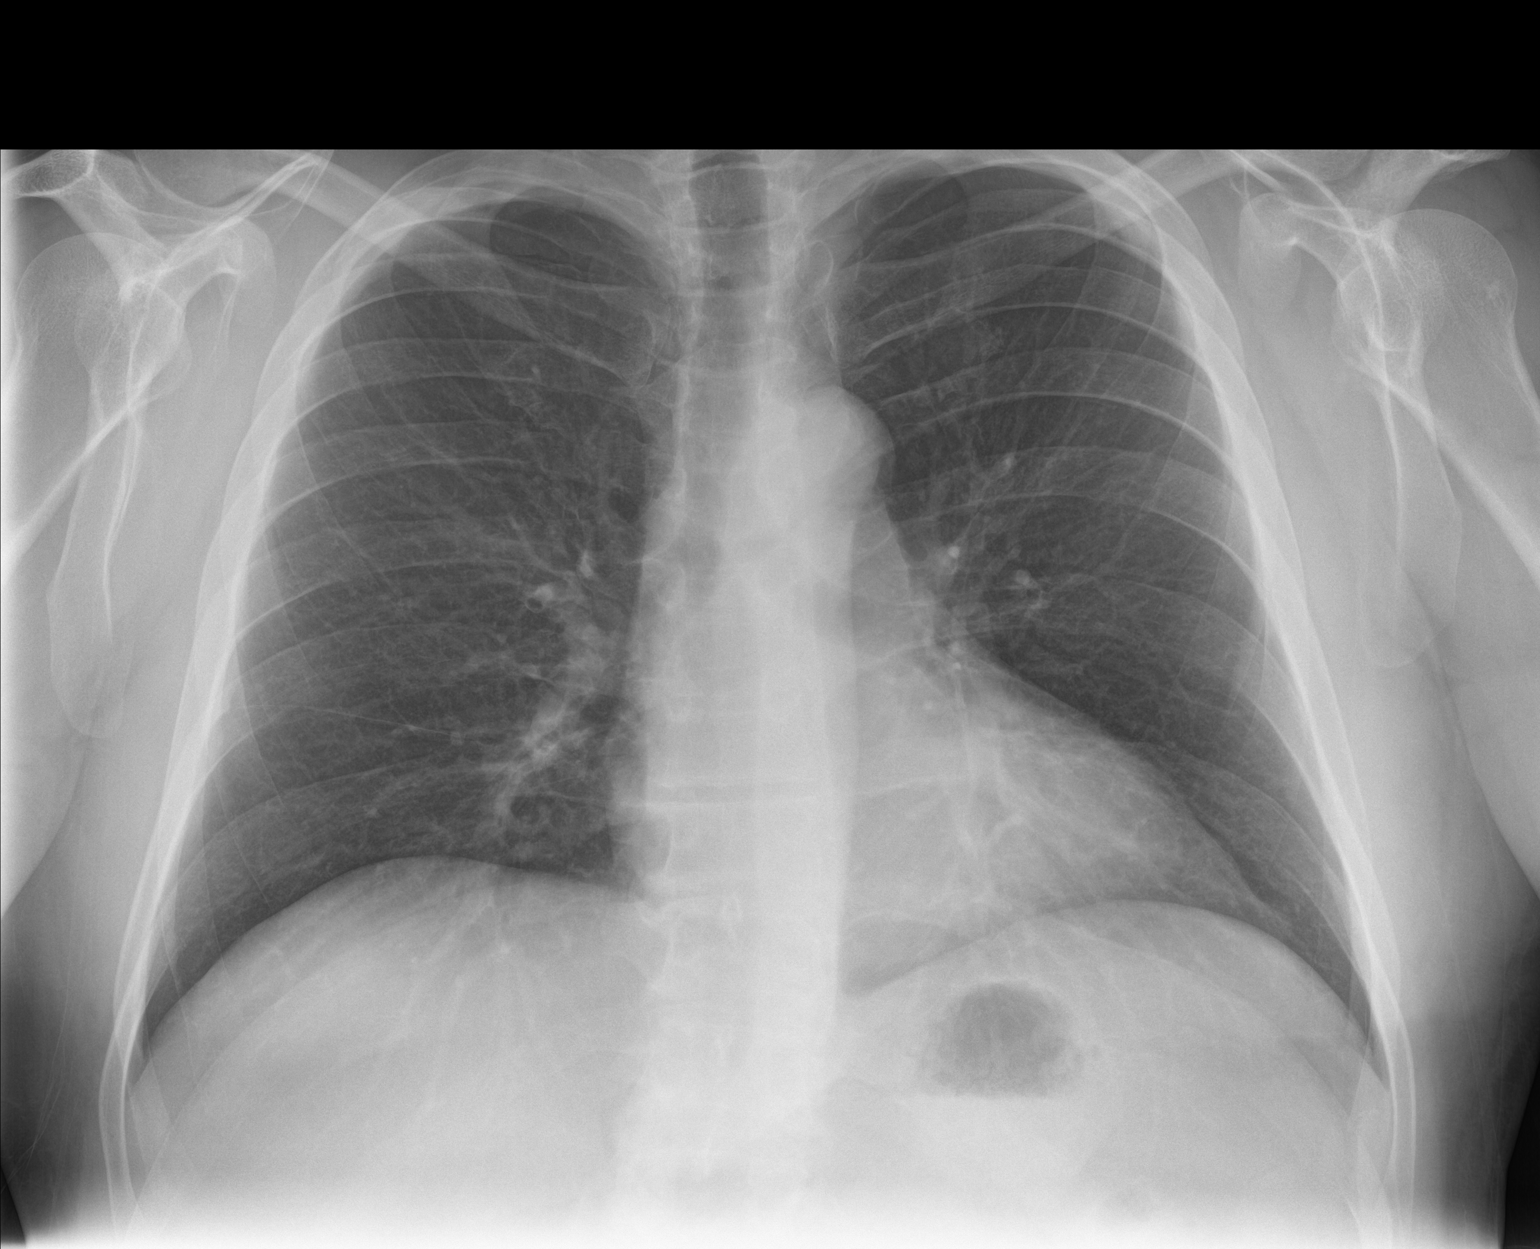

[Series 2: chest lat · 0.14mm/px · 2 of 2 slices shown]
[im 1/2]
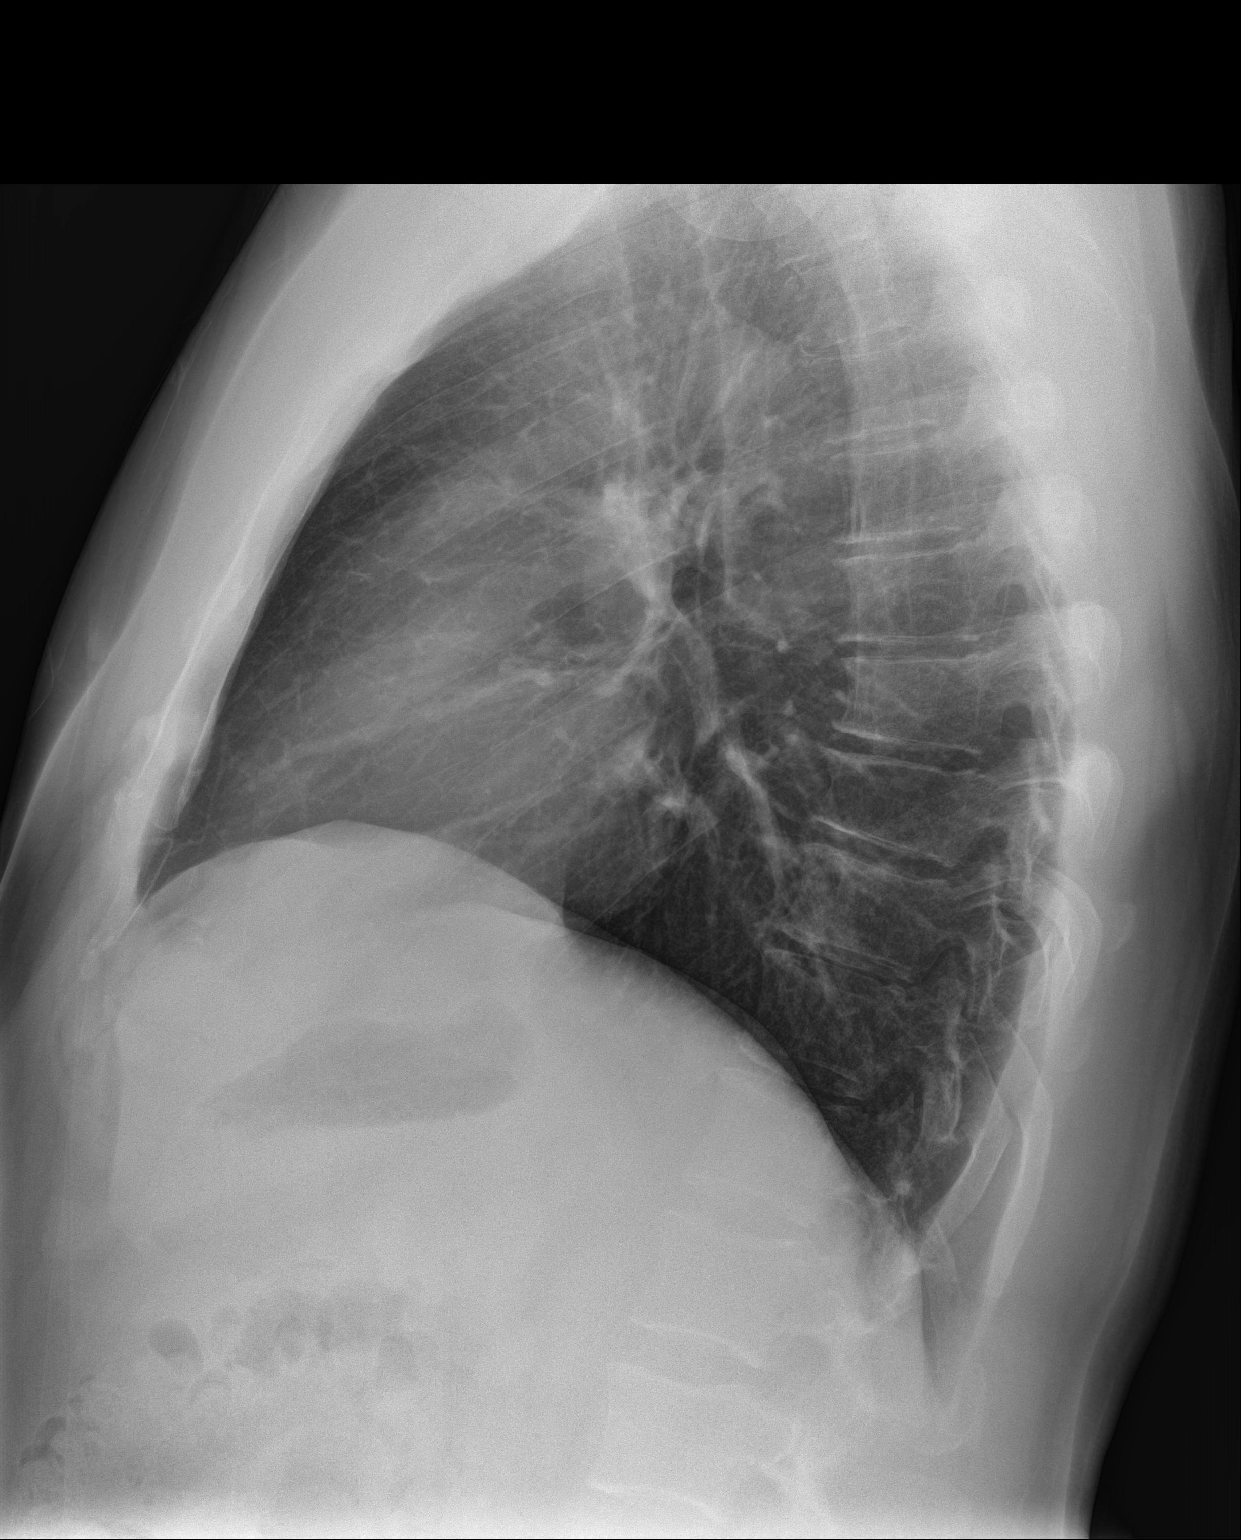
[im 2/2]
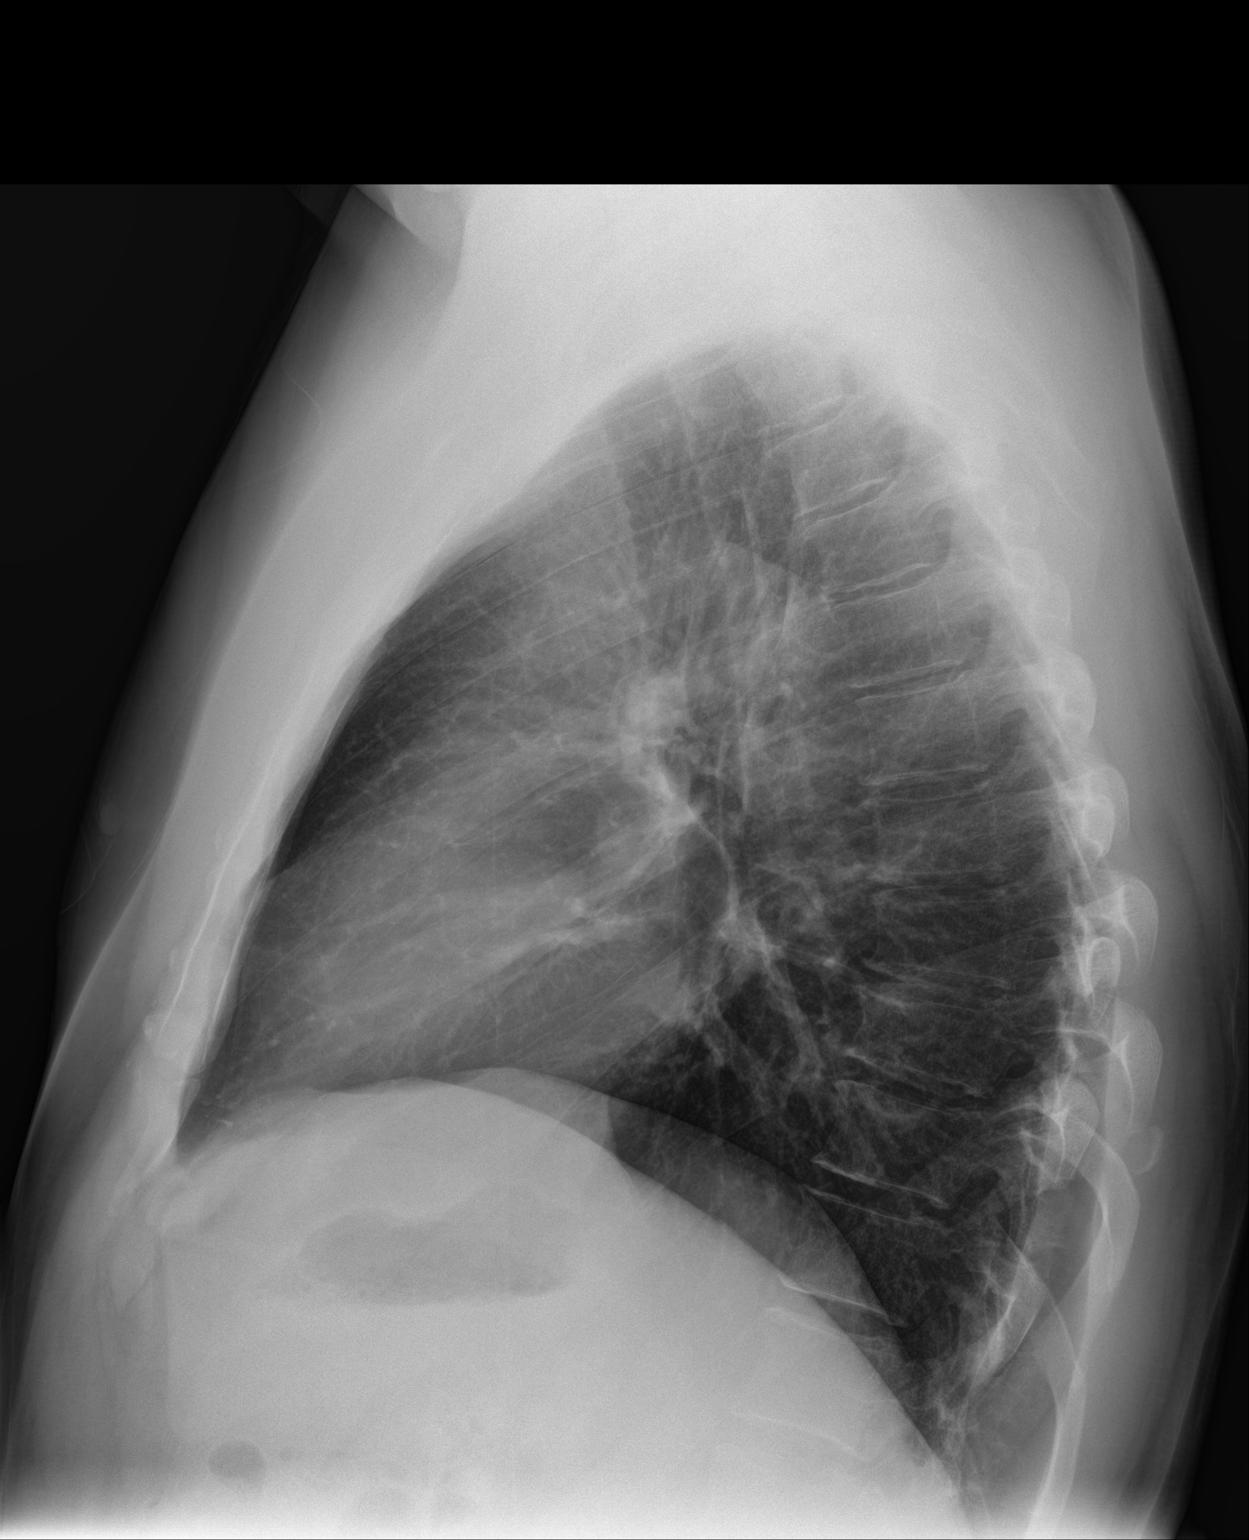

[3 of 3 positions shown; findings below may reference images not displayed]

FINDINGS: The heart size and mediastinal contours are within normal limits.
Both lungs are clear. The visualized skeletal structures are
unremarkable.
IMPRESSION: No active cardiopulmonary disease.

## 2020-03-25 IMAGING — CR CHEST - 2 VIEW
2 series · 2 of 2 positions shown · non-contrast
Comparison: 09/27/2018

CLINICAL DATA: Cough and shortness of breath.

EXAM:
CHEST - 2 VIEW

[chest pa]
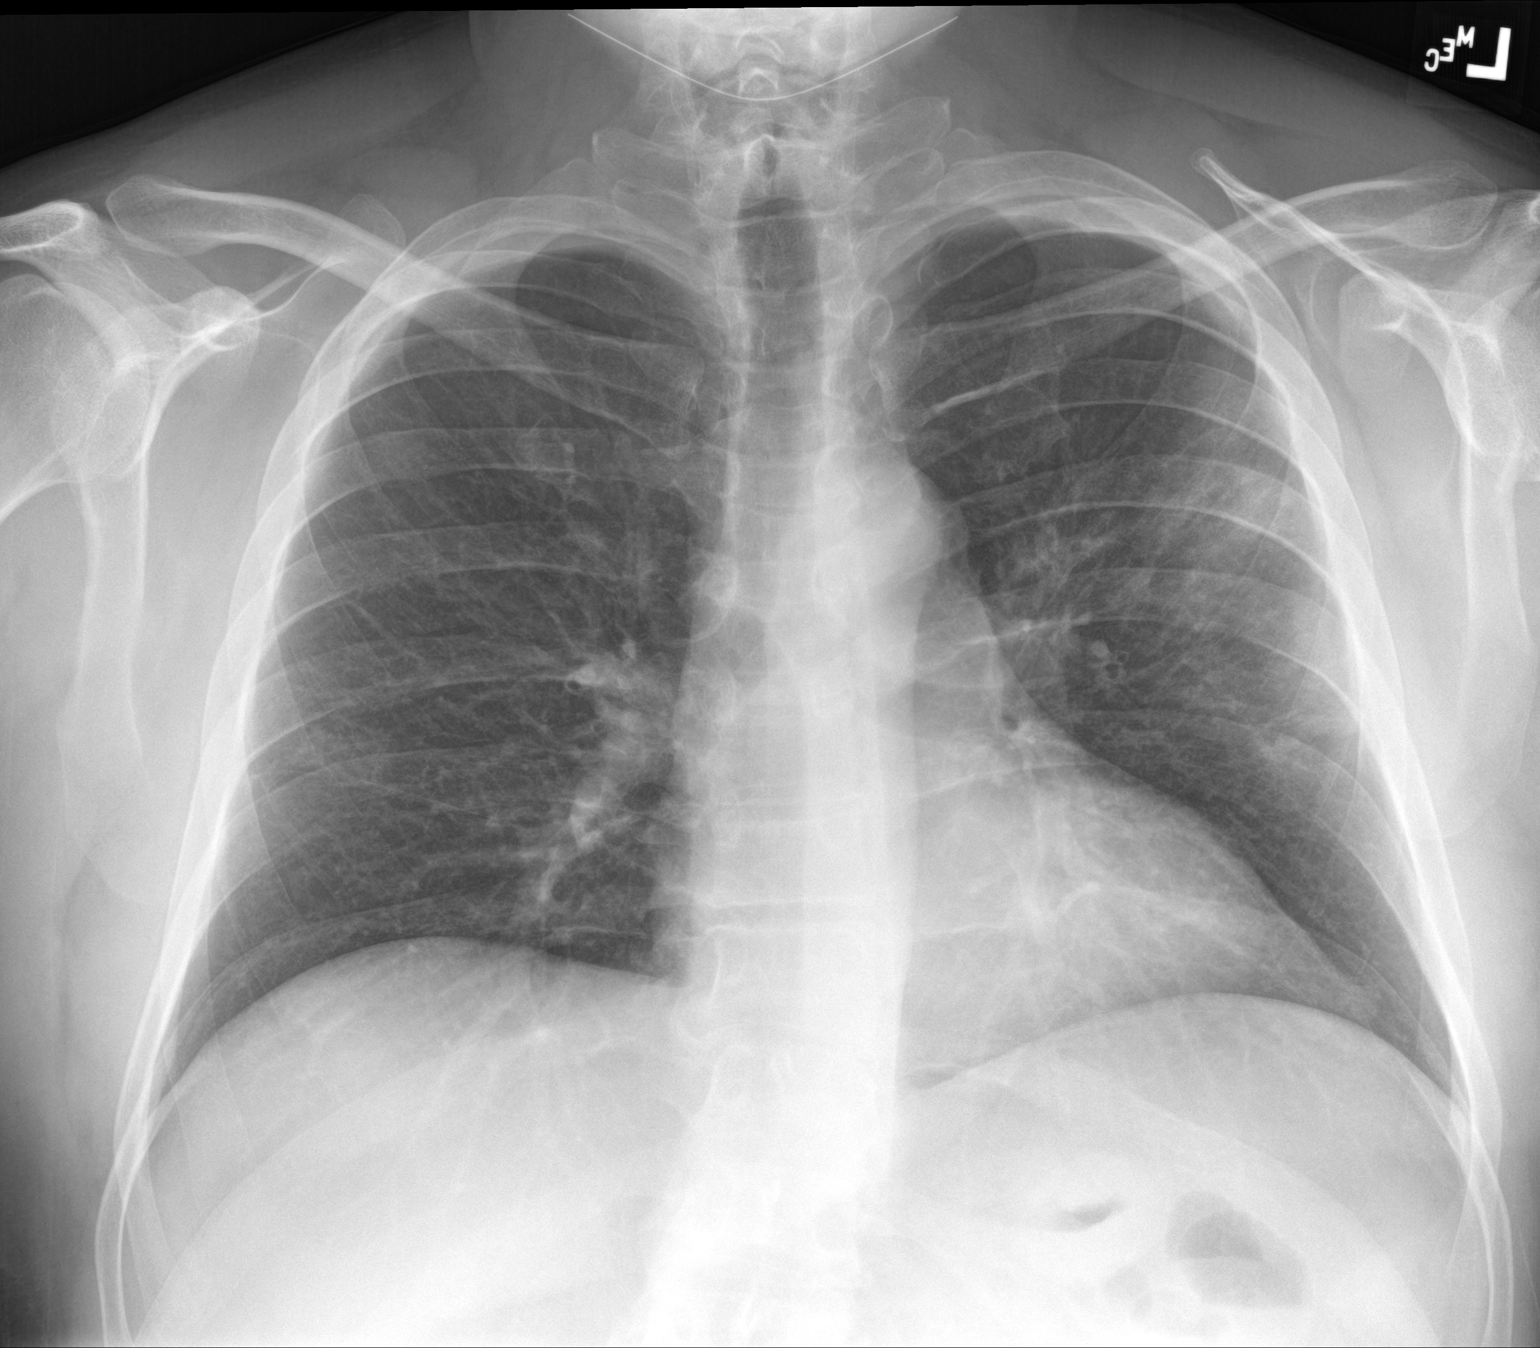

[chest lat]
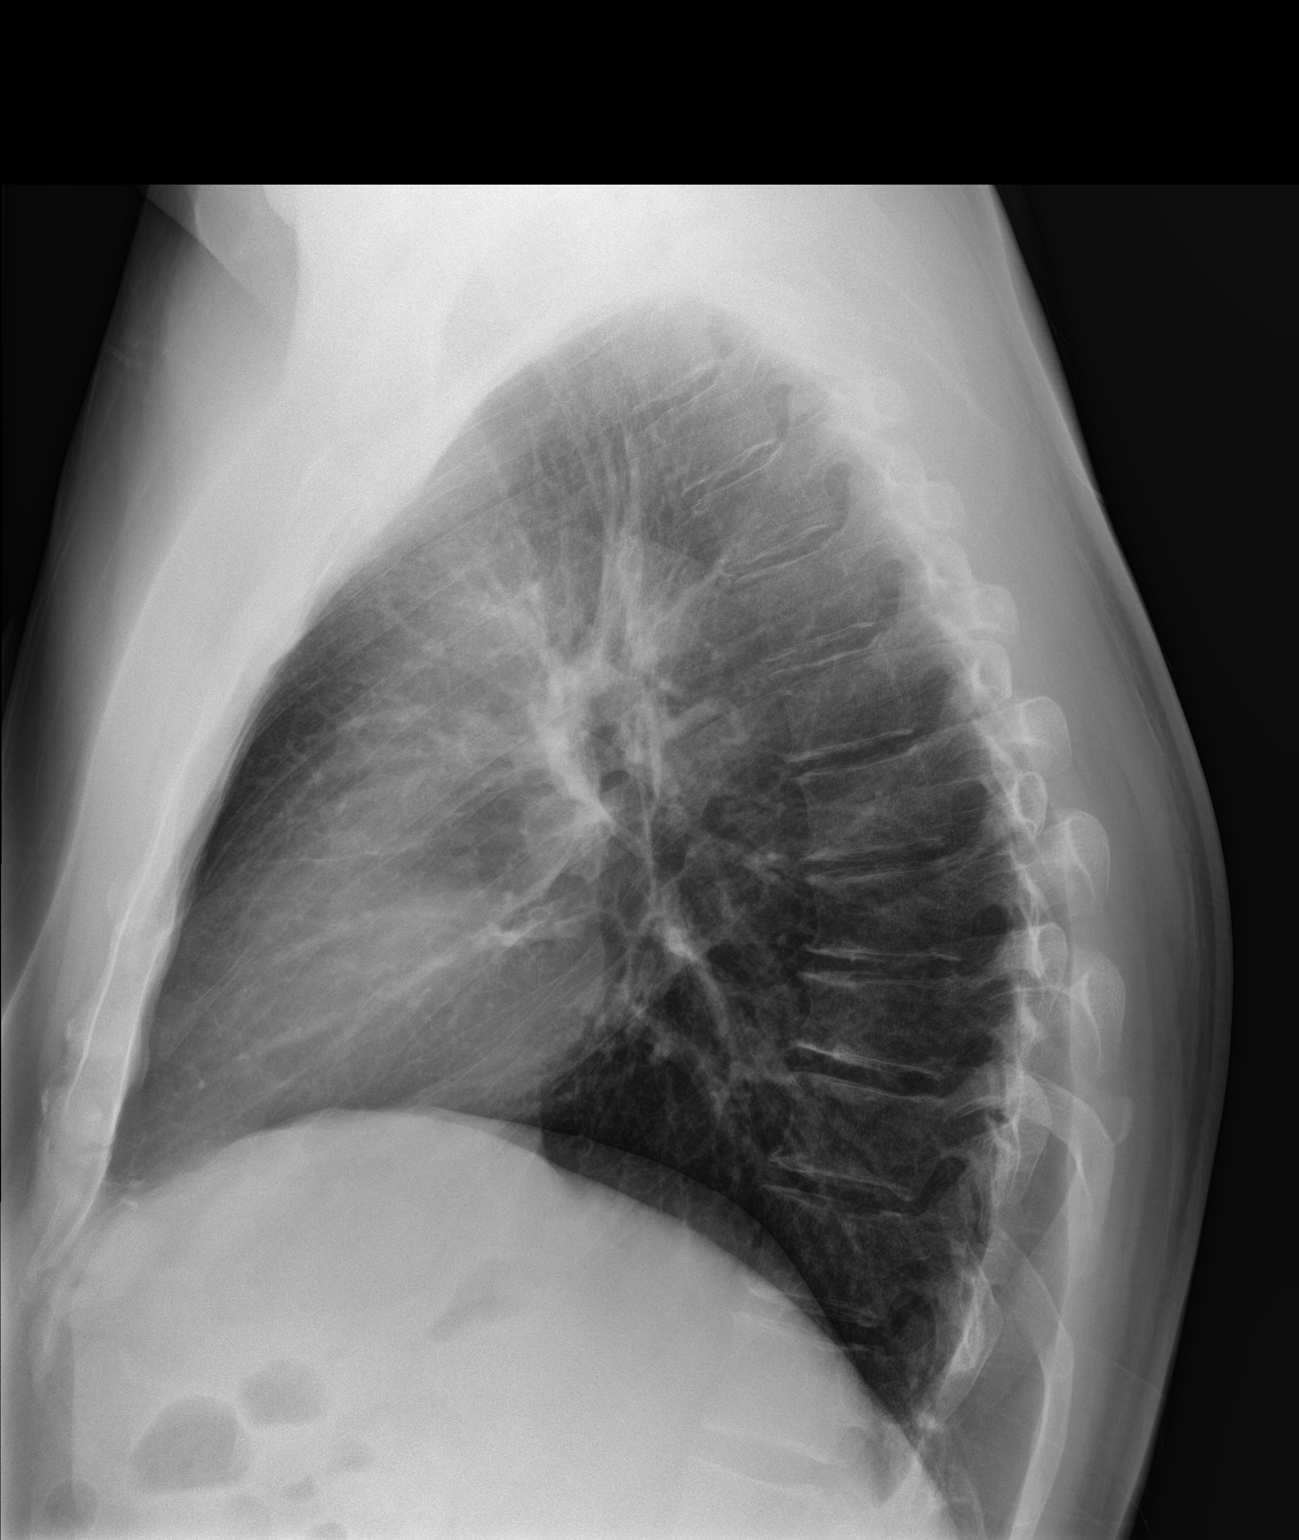

[2 of 2 positions shown; findings below may reference images not displayed]

FINDINGS: New patchy densities along the periphery of the left lung and left
hilar region. Few densities in the periphery of the mid right lung.
Heart size is within normal limits and stable. Negative for a
pneumothorax. Trachea is midline. No large pleural effusions. Bone
structures are unremarkable.
IMPRESSION: New airspace densities in left lung. Findings are compatible with
pneumonia. Question a small focus of infection in the right lung as
described.
